# Patient Record
Sex: Male | Born: 1965 | Race: White | Hispanic: No | Smoking: Never smoker
Health system: Southern US, Community
[De-identification: ages and names within clinical notes are randomized; demographics above are authoritative.]

## PROBLEM LIST (undated history)

## (undated) DIAGNOSIS — M1990 Unspecified osteoarthritis, unspecified site: Secondary | ICD-10-CM

## (undated) HISTORY — PX: CHOLECYSTECTOMY: SHX55

---

## 2013-12-02 ENCOUNTER — Encounter (HOSPITAL_COMMUNITY): Payer: Self-pay | Admitting: Emergency Medicine

## 2013-12-02 ENCOUNTER — Emergency Department (HOSPITAL_COMMUNITY)
Admission: EM | Admit: 2013-12-02 | Discharge: 2013-12-02 | Disposition: A | Discharge status: Home / Self Care | Payer: Self-pay | Attending: Emergency Medicine | Admitting: Emergency Medicine

## 2013-12-02 DIAGNOSIS — M5430 Sciatica, unspecified side: Secondary | ICD-10-CM | POA: Insufficient documentation

## 2013-12-02 DIAGNOSIS — Z8739 Personal history of other diseases of the musculoskeletal system and connective tissue: Secondary | ICD-10-CM | POA: Insufficient documentation

## 2013-12-02 DIAGNOSIS — M545 Low back pain, unspecified: Secondary | ICD-10-CM | POA: Insufficient documentation

## 2013-12-02 DIAGNOSIS — M5432 Sciatica, left side: Secondary | ICD-10-CM

## 2013-12-02 HISTORY — DX: Unspecified osteoarthritis, unspecified site: M19.90

## 2013-12-02 MED ORDER — OXYCODONE-ACETAMINOPHEN 5-325 MG PO TABS: 2.0000 | ORAL_TABLET | ORAL | Status: AC | PRN

## 2013-12-02 MED ORDER — PREDNISONE 20 MG PO TABS: ORAL_TABLET | ORAL | Status: AC

## 2013-12-02 MED ORDER — OXYCODONE-ACETAMINOPHEN 5-325 MG PO TABS
1.0000 | ORAL_TABLET | Freq: Once | ORAL | Status: AC
  Administered 2013-12-02: 1 via ORAL
  Filled 2013-12-02: qty 1

## 2013-12-02 MED ORDER — PREDNISONE 20 MG PO TABS
60.0000 mg | ORAL_TABLET | Freq: Once | ORAL | Status: AC
  Administered 2013-12-02: 60 mg via ORAL
  Filled 2013-12-02: qty 3

## 2013-12-02 NOTE — ED Provider Notes (Signed)
CSN: [id number]     Arrival date & time 12/02/13  2148 History  This chart was scribed for non-physician practitioner, Fayrene Helper, PA-C working with Doug Sou, MD by Luisa Dago, ED scribe. This patient was seen in room TR08C/TR08C and the patient's care was started at 10:26 PM.     Chief Complaint  Patient presents with  . Back Pain   The history is provided by the patient. No language interpreter was used.   HPI Comments: Ethan Kennedy is a 48 y.o. male with a hx of chronic back pain x 1.5 years, presents to the Emergency Department complaining of worsening lower back pain x1 week. He states that the pain radiates down his left leg. Pt states that 2012 he bent over and had "a pinch" to his back and was diagnosed with sciatica by local orthopaedist in [place].  Pt states that 2 years later he injured his back again and was fired at work; He states that he injured it unloading a truck March 2014. He describes the pain as "achy" in nature and he states that it is worsened by bearing weight and movement. Pt also endorses burning numbness to the side of his left leg. He reports taking  of Ibuprofen with minimal relief. Wife states that pt is unable to put on his shoes by himself. He Denies any fecal incontinence, fever, hx of cancer, IV drug use, smoking, bladder incontinence.  Pt states that he has no insurance coverage.     Past Medical History  Diagnosis Date  . Arthritis    Past Surgical History  Procedure Laterality Date  . Cholecystectomy     No family history on file. History  Substance Use Topics  . Smoking status: Never Smoker   . Smokeless tobacco: Not on file  . Alcohol Use: No    Review of Systems  Constitutional: Negative for fever.  Respiratory: Negative for shortness of breath.   Cardiovascular: Negative for chest pain and leg swelling.  Gastrointestinal: Negative for abdominal pain, constipation and abdominal distention.  Genitourinary: Negative for  dysuria, urgency, frequency, flank pain and difficulty urinating.  Musculoskeletal: Positive for back pain. Negative for gait problem and joint swelling.  Skin: Negative for rash.  Neurological: Positive for numbness. Negative for weakness.      Allergies  Review of patient's allergies indicates no known allergies.  Home Medications   Prior to Admission medications   Not on File   BP 173/99  Pulse 77  Temp(Src) 97.8 F (36.6 C) (Oral)  Resp 20  Ht  (1.676 m)  Wt 210 lb (95.255 kg)  BMI 33.91 kg/m2  SpO2 98%  Physical Exam  Nursing note and vitals reviewed. Constitutional: He is oriented to person, place, and time. He appears well-developed and well-nourished. No distress.  HENT:  Head: Normocephalic and atraumatic.  Eyes: Conjunctivae and EOM are normal.  Neck: Neck supple.  Cardiovascular: Normal rate.   Pulmonary/Chest: Effort normal. No respiratory distress.  Musculoskeletal: Normal range of motion.  Tenderness to lumbar and paralumbar. No crepitus or step-offs. Pain along lumbosacral region. Patellar reflex is intact. There is no foot drop. Positive straight leg raises. Lower extremity without any palpable chords.   Neurological: He is alert and oriented to person, place, and time.  Skin: Skin is warm and dry.  Psychiatric: He has a normal mood and affect. His behavior is normal.    ED Course  Procedures (including critical care time)  DIAGNOSTIC STUDIES: Oxygen Saturation is 98% on  RA, normal by my interpretation.    COORDINATION OF CARE: 10:35 PM- chronic sciatica related pain, no red flags.  Able to ambulate, Will give pt a referral to orthopaedist on call. Pt may benefit outpt MRI as needed.  Treatment for sciatica prescribed.  Pain medication given here in ER.  Pt advised of plan for treatment and pt agrees.  Labs Review Labs Reviewed - No data to display  Imaging Review No results found.   EKG Interpretation None      MDM   Final  diagnoses:  Sciatica, left    BP 173/99  Pulse 77  Temp(Src) 97.8 F (36.6 C) (Oral)  Resp 20  Ht  (1.676 m)  Wt 210 lb (95.255 kg)  BMI 33.91 kg/m2  SpO2 98%   I personally performed the services described in this documentation, which was scribed in my presence. The recorded information has been reviewed and is accurate.    Fayrene Helper, PA-C 12/02/13 2250

## 2013-12-02 NOTE — Discharge Instructions (Signed)
Sciatica Sciatica is pain, weakness, numbness, or tingling along the path of the sciatic nerve. The nerve starts in the lower back and runs down the back of each leg. The nerve controls the muscles in the lower leg and in the back of the knee, while also providing sensation to the back of the thigh, lower leg, and the sole of your foot. Sciatica is a symptom of another medical condition. For instance, nerve damage or certain conditions, such as a herniated disk or bone spur on the spine, pinch or put pressure on the sciatic nerve. This causes the pain, weakness, or other sensations normally associated with sciatica. Generally, sciatica only affects one side of the body. CAUSES   Herniated or slipped disc.  Degenerative disk disease.  A pain disorder involving the narrow muscle in the buttocks (piriformis syndrome).  Pelvic injury or fracture.  Pregnancy.  Tumor (rare). SYMPTOMS  Symptoms can vary from mild to very severe. The symptoms usually travel from the low back to the buttocks and down the back of the leg. Symptoms can include:  Mild tingling or dull aches in the lower back, leg, or hip.  Numbness in the back of the calf or sole of the foot.  Burning sensations in the lower back, leg, or hip.  Sharp pains in the lower back, leg, or hip.  Leg weakness.  Severe back pain inhibiting movement. These symptoms may get worse with coughing, sneezing, laughing, or prolonged sitting or standing. Also, being overweight may worsen symptoms. DIAGNOSIS  Your caregiver will perform a physical exam to look for common symptoms of sciatica. He or she may ask you to do certain movements or activities that would trigger sciatic nerve pain. Other tests may be performed to find the cause of the sciatica. These may include:  Blood tests.  X-rays.  Imaging tests, such as an MRI or CT scan. TREATMENT  Treatment is directed at the cause of the sciatic pain. Sometimes, treatment is not necessary  and the pain and discomfort goes away on its own. If treatment is needed, your caregiver may suggest:  Over-the-counter medicines to relieve pain.  Prescription medicines, such as anti-inflammatory medicine, muscle relaxants, or narcotics.  Applying heat or ice to the painful area.  Steroid injections to lessen pain, irritation, and inflammation around the nerve.  Reducing activity during periods of pain.  Exercising and stretching to strengthen your abdomen and improve flexibility of your spine. Your caregiver may suggest losing weight if the extra weight makes the back pain worse.  Physical therapy.  Surgery to eliminate what is pressing or pinching the nerve, such as a bone spur or part of a herniated disk. HOME CARE INSTRUCTIONS   Only take over-the-counter or prescription medicines for pain or discomfort as directed by your caregiver.  Apply ice to the affected area for 20 minutes, 3-4 times a day for the first 48-72 hours. Then try heat in the same way.  Exercise, stretch, or perform your usual activities if these do not aggravate your pain.  Attend physical therapy sessions as directed by your caregiver.  Keep all follow-up appointments as directed by your caregiver.  Do not wear high heels or shoes that do not provide proper support.  Check your mattress to see if it is too soft. A firm mattress may lessen your pain and discomfort. SEEK IMMEDIATE MEDICAL CARE IF:   You lose control of your bowel or bladder (incontinence).  You have increasing weakness in the lower back, pelvis, buttocks,   or legs.  You have redness or swelling of your back.  You have a burning sensation when you urinate.  You have pain that gets worse when you lie down or awakens you at night.  Your pain is worse than you have experienced in the past.  Your pain is lasting longer than 4 weeks.  You are suddenly losing weight without reason. MAKE SURE YOU:  Understand these  instructions.  Will watch your condition.  Will get help right away if you are not doing well or get worse. Document Released: 03/14/2001 Document Revised: 09/19/2011 Document Reviewed: 07/30/2011 [employer] Patient Information 2015 [employer]. This information is not intended to replace advice given to you by your health care provider. Make sure you discuss any questions you have with your health care provider.  

## 2013-12-02 NOTE — ED Notes (Signed)
Pt reports chronic lower back pain x 1.5 years. States he ran out of medication last denies. Denies any new changes. Denies urinary/bowel incontinence.

## 2013-12-03 NOTE — ED Provider Notes (Signed)
Medical screening examination/treatment/procedure(s) were performed by non-physician practitioner and as supervising physician I was immediately available for consultation/collaboration.   EKG Interpretation None       Callaghan Laverdure, MD 12/03/13 0145 

## 2014-01-07 ENCOUNTER — Other Ambulatory Visit (HOSPITAL_COMMUNITY): Payer: Self-pay | Admitting: Orthopedic Surgery

## 2014-01-07 DIAGNOSIS — M5442 Lumbago with sciatica, left side: Secondary | ICD-10-CM

## 2014-01-15 ENCOUNTER — Ambulatory Visit (HOSPITAL_COMMUNITY)
Admission: RE | Admit: 2014-01-15 | Discharge: 2014-01-15 | Disposition: A | Discharge status: Home / Self Care | Payer: Self-pay | Source: Ambulatory Visit | Attending: Orthopedic Surgery | Admitting: Orthopedic Surgery

## 2014-01-15 DIAGNOSIS — M5442 Lumbago with sciatica, left side: Secondary | ICD-10-CM | POA: Insufficient documentation

## 2016-04-08 IMAGING — MR MR LUMBAR SPINE W/O CM
4 of 5 series · 19 of 48 positions shown · non-contrast
Comparison: None.

CLINICAL DATA: Low back pain radiating down the left leg with
numbness and weakness for 1 year.

EXAM:
MRI LUMBAR SPINE WITHOUT CONTRAST
TECHNIQUE: Multiplanar, multisequence MR imaging of the lumbar spine was
performed. No intravenous contrast was administered.

[Series 3: T2 · sagittal · 4.0mm · 0.55mm/px · 6 of 13 slices shown (1 of 2)]
[im 1/13]
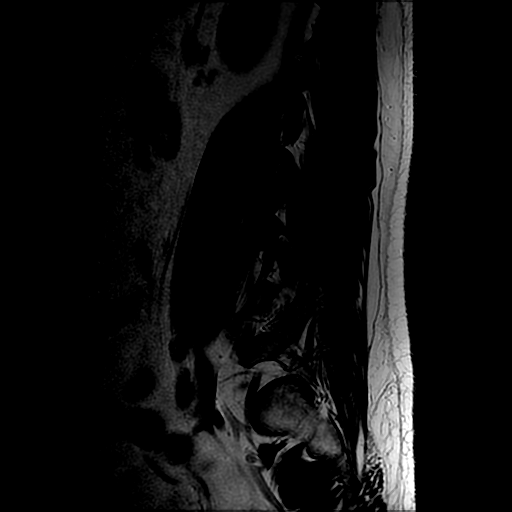
[im 3/13]
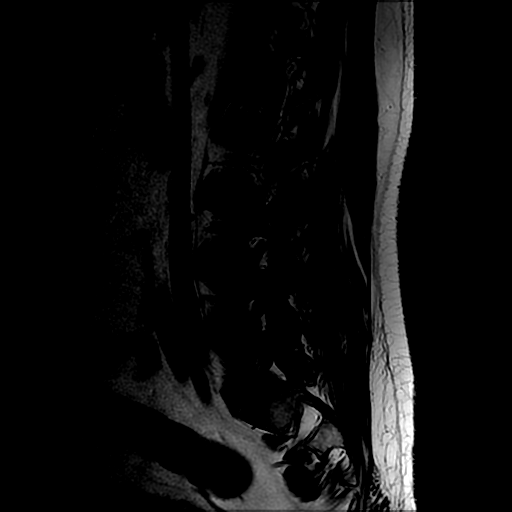
[im 5/13]
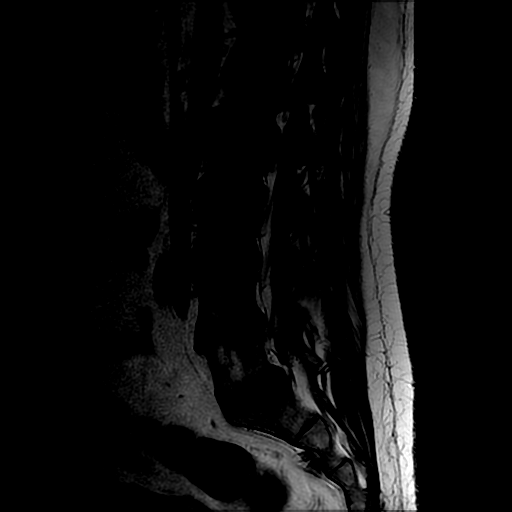
[im 8/13]
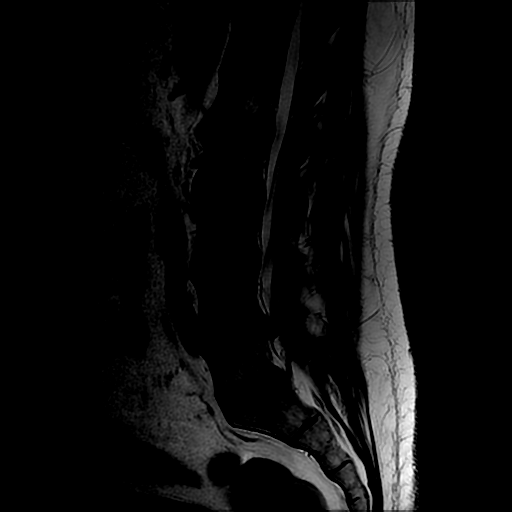
[im 10/13]
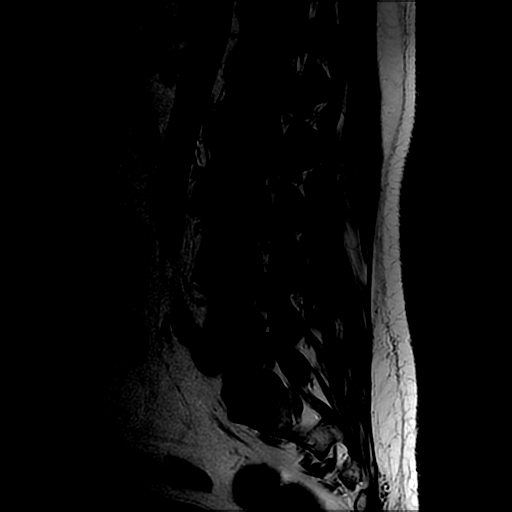
[im 13/13]
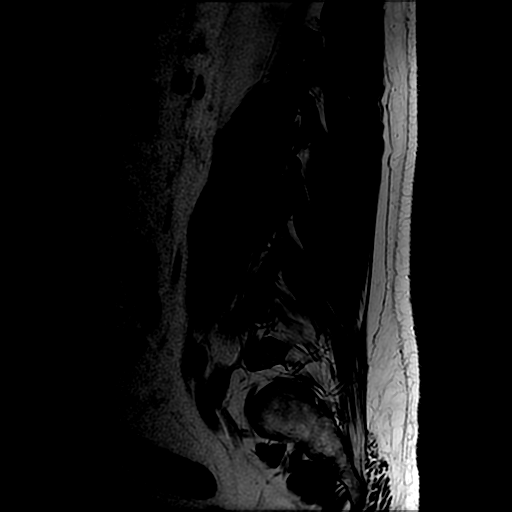

[Series 4: T1 · sagittal · 4.0mm · 0.55mm/px · 3 of 13 slices shown (1 of 2)]
[im 3/13]
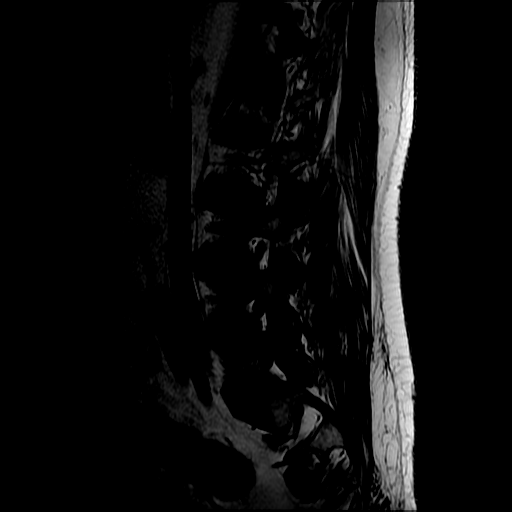
[im 8/13]
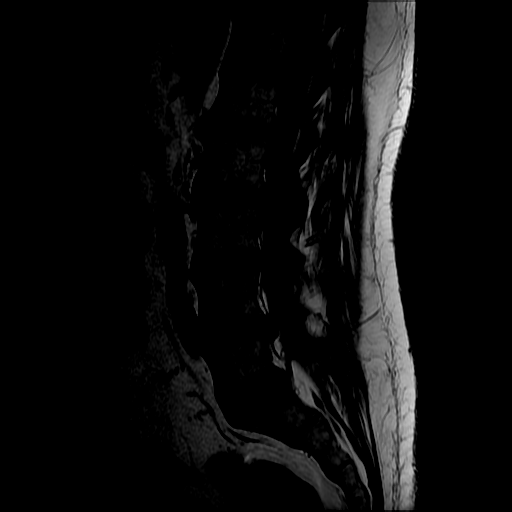
[im 13/13]
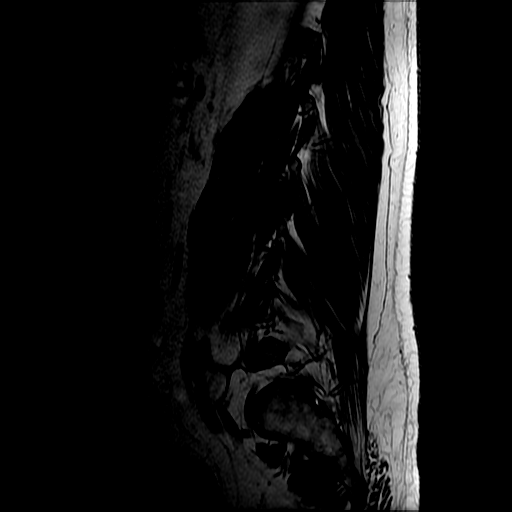

[Series 6: T2 · axial · 4.0mm · 0.39mm/px · z∈[-73,+100]mm · 7 of 34 slices shown (2 of 2)]
[im 1/34]
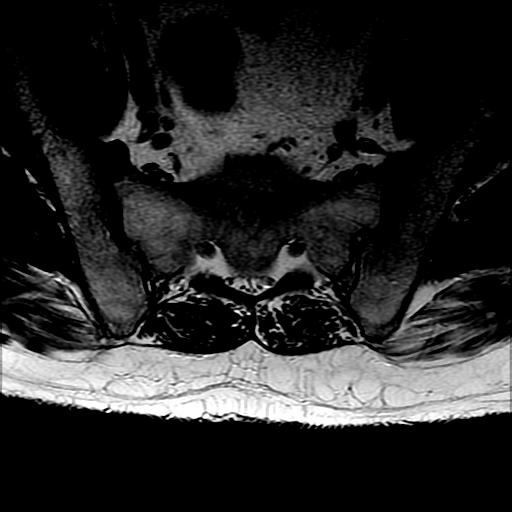
[im 5/34]
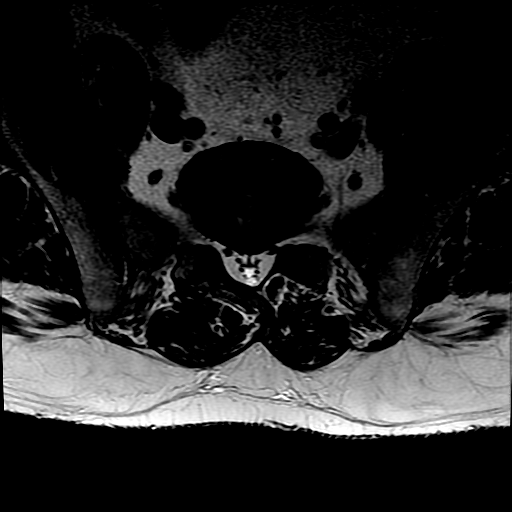
[im 10/34]
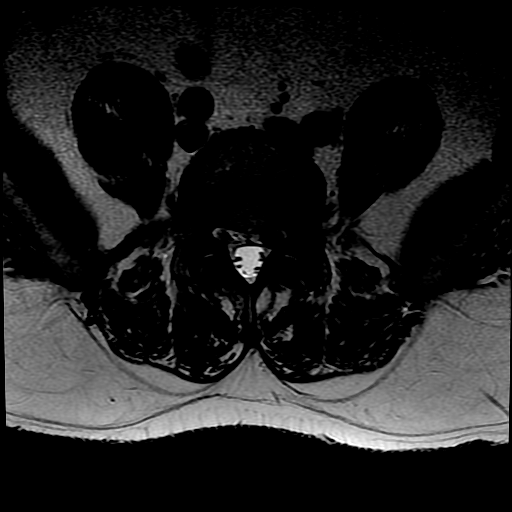
[im 15/34]
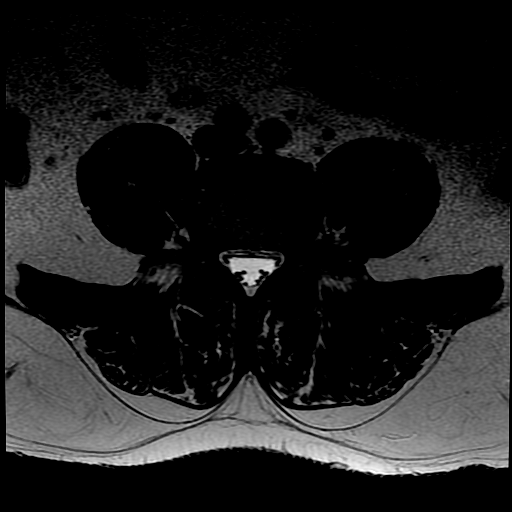
[im 17/34]
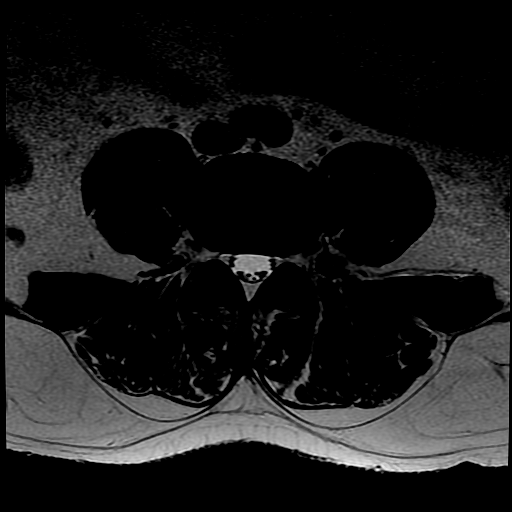
[im 19/34]
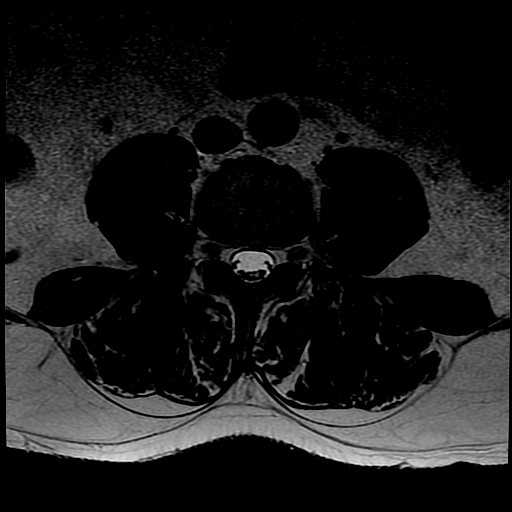
[im 29/34]
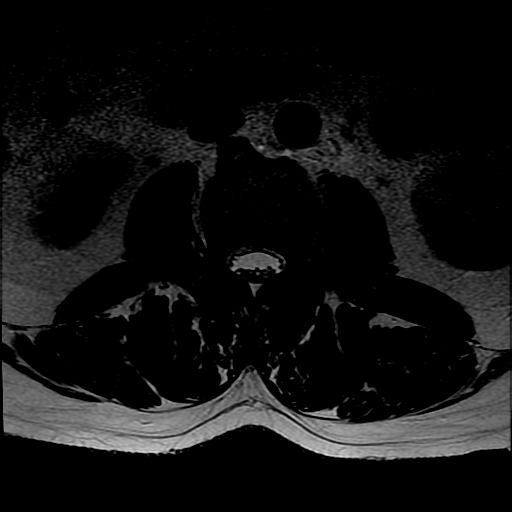

[Series 7: T1 · axial · 4.0mm · 0.39mm/px · z∈[-54,+100]mm · 3 of 34 slices shown (2 of 2)]
[im 5/34]
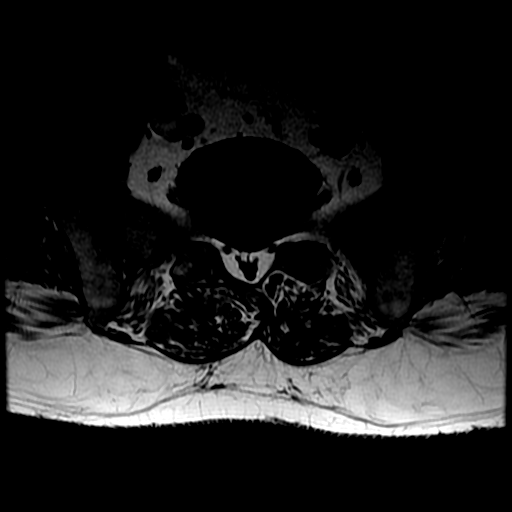
[im 17/34]
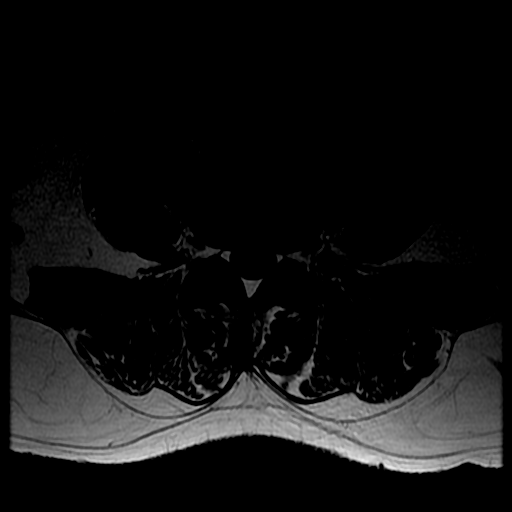
[im 29/34]
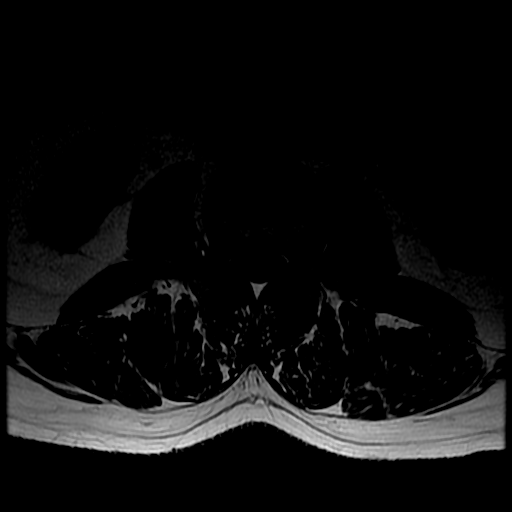

[19 of 48 positions shown; findings below may reference images not displayed]

FINDINGS: The lowest lumbar type non-rib-bearing vertebra is labeled as L5.
The conus medullaris appears normal. Conus level: T12-L1.

There is 5 mm anterolisthesis at L5-S1 with right unilateral chronic
pars defect at L5.

Disc desiccation and loss of disc height at L4-5. Hemangioma in the
L5 vertebral body with a smaller hemangioma in the L4 vertebral body
and in the L1 vertebral body.

Spurring of the right upper sacroiliac joint.

Additional findings at individual levels are as follows:

L1-2: No impingement. Diffuse disc bulge and small right inferior
foraminal disc protrusion.

L2-3: Borderline central narrowing of the thecal sac and borderline
displacement of both L2 nerves in the lateral extraforaminal space
due to disc bulge and facet arthropathy.

L3-4:  No impingement.  Mild disc bulge and mild facet arthropathy.

L4-5: Prominent left and borderline right subarticular lateral
recess stenosis with mild right foraminal stenosis due to left
lateral recess disc protrusion, disc bulge, and mild facet
arthropathy.

L5-S1: Mild left subarticular lateral recess stenosis due to facet
arthropathy and disc uncovering.
IMPRESSION: 1. Lumbar spondylosis and degenerative disc disease, causing
prominent impingement at L4-5 (due to a disc protrusion) and mild
impingement at L5-S1, as detailed above.
2. 5 mm anterolisthesis at L5-S1 with right unilateral chronic pars
defect at L5.
3. Incidental lumbar vertebral hemangiomatosis.
4. Spurring of the right upper sacroiliac joint.
